# Patient Record
Sex: Female | Born: 1935 | Race: White | Hispanic: No | Marital: Single
Health system: Southern US, Community
[De-identification: ages and names within clinical notes are randomized; demographics above are authoritative.]

## PROBLEM LIST (undated history)

## (undated) DIAGNOSIS — G309 Alzheimer's disease, unspecified: Secondary | ICD-10-CM

## (undated) DIAGNOSIS — I10 Essential (primary) hypertension: Secondary | ICD-10-CM

## (undated) DIAGNOSIS — E119 Type 2 diabetes mellitus without complications: Secondary | ICD-10-CM

## (undated) DIAGNOSIS — F0280 Dementia in other diseases classified elsewhere without behavioral disturbance: Secondary | ICD-10-CM

---

## 2021-06-20 ENCOUNTER — Other Ambulatory Visit: Payer: Self-pay

## 2021-06-20 ENCOUNTER — Emergency Department (HOSPITAL_COMMUNITY)
Admission: EM | Admit: 2021-06-20 | Discharge: 2021-06-20 | Disposition: A | Discharge status: Home / Self Care | Payer: Medicare Other | Attending: Emergency Medicine | Admitting: Emergency Medicine

## 2021-06-20 ENCOUNTER — Emergency Department (HOSPITAL_COMMUNITY): Payer: Medicare Other

## 2021-06-20 ENCOUNTER — Encounter (HOSPITAL_COMMUNITY): Payer: Self-pay

## 2021-06-20 DIAGNOSIS — G309 Alzheimer's disease, unspecified: Secondary | ICD-10-CM | POA: Diagnosis not present

## 2021-06-20 DIAGNOSIS — M25552 Pain in left hip: Secondary | ICD-10-CM | POA: Insufficient documentation

## 2021-06-20 DIAGNOSIS — W19XXXA Unspecified fall, initial encounter: Secondary | ICD-10-CM

## 2021-06-20 DIAGNOSIS — E119 Type 2 diabetes mellitus without complications: Secondary | ICD-10-CM | POA: Insufficient documentation

## 2021-06-20 DIAGNOSIS — S0003XA Contusion of scalp, initial encounter: Secondary | ICD-10-CM | POA: Diagnosis not present

## 2021-06-20 DIAGNOSIS — E039 Hypothyroidism, unspecified: Secondary | ICD-10-CM | POA: Diagnosis not present

## 2021-06-20 DIAGNOSIS — W01198A Fall on same level from slipping, tripping and stumbling with subsequent striking against other object, initial encounter: Secondary | ICD-10-CM | POA: Insufficient documentation

## 2021-06-20 DIAGNOSIS — Y92121 Bathroom in nursing home as the place of occurrence of the external cause: Secondary | ICD-10-CM | POA: Diagnosis not present

## 2021-06-20 DIAGNOSIS — S0990XA Unspecified injury of head, initial encounter: Secondary | ICD-10-CM | POA: Diagnosis present

## 2021-06-20 DIAGNOSIS — I10 Essential (primary) hypertension: Secondary | ICD-10-CM | POA: Diagnosis not present

## 2021-06-20 HISTORY — DX: Essential (primary) hypertension: I10

## 2021-06-20 HISTORY — DX: Alzheimer's disease, unspecified: G30.9

## 2021-06-20 HISTORY — DX: Type 2 diabetes mellitus without complications: E11.9

## 2021-06-20 HISTORY — DX: Dementia in other diseases classified elsewhere, unspecified severity, without behavioral disturbance, psychotic disturbance, mood disturbance, and anxiety: F02.80

## 2021-06-20 NOTE — ED Notes (Signed)
Pt able to ambulate with walker at her baseline. Pt denies feeling lightheaded or dizzy. Pt has slow steady gait  ?

## 2021-06-20 NOTE — ED Provider Notes (Signed)
[facility]   CSN: [id number] Arrival date & time: 06/20/21  1039     History  Chief Complaint  Patient presents with   Madison BellingFall    Madison Cummings is a 88 y.o. female.  HPI      88 year old female with history of Alzheimer's, presents from [facility] with concern for unwitnessed fall.  She fell backwards hitting the back of her head. Reports left sided hip pain with EMS, denies at time of my evaluation.  Was able to stand and pivot with EMS.    Family at bedside reports she is at her baseline.  Deny other acute medical concerns.   She is hard of hearing, has dementia, denies any pain on my history. History limited by above.    Home Medications Prior to Admission medications   Not on File      Allergies    Patient has no known allergies.    Review of Systems   Review of Systems See above Physical Exam Updated Vital Signs BP 119/73 (BP Location: Right Arm)    Pulse 70    Temp 97.8 F (36.6 C) (Oral)    Resp 16    SpO2 96%  Physical Exam Vitals and nursing note reviewed.  Constitutional:      General: She is not in acute distress.    Appearance: She is well-developed. She is not diaphoretic.  HENT:     Head: Normocephalic.     Comments: Occipital scalp hematoma, abrasion Eyes:     Conjunctiva/sclera: Conjunctivae normal.  Cardiovascular:     Rate and Rhythm: Normal rate and regular rhythm.     Heart sounds: Normal heart sounds. No murmur heard.   No friction rub. No gallop.  Pulmonary:     Effort: Pulmonary effort is normal. No respiratory distress.     Breath sounds: Normal breath sounds. No wheezing or rales.  Chest:     Chest wall: No tenderness.  Abdominal:     General: There is no distension.     Palpations: Abdomen is soft.     Tenderness: There is no abdominal tenderness. There is no guarding.  Musculoskeletal:        General: No tenderness (no C/T/L/lower extremity or pelvic  tenderness).     Cervical back: Normal range of motion.  Skin:    General: Skin is warm and dry.     Findings: No erythema or rash.  Neurological:     Mental Status: She is alert and oriented to person, place, and time.    ED Results / Procedures / Treatments   Labs (all labs ordered are listed, but only abnormal results are displayed) Labs Reviewed - No data to display  EKG EKG Interpretation  Date/Time:  Monday June 20 2021 10:52:38 EDT Ventricular Rate:  80 PR Interval:  51 QRS Duration: 84 QT Interval:  413 QTC Calculation: 477 R Axis:   79 Text Interpretation: Sinus rhythm Short PR interval Low voltage, extremity leads Nonspecific T abnormalities, lateral leads No previous ECGs available Confirmed by Alvira MondaySchlossman, Emaleigh Guimond ([id number]) on 06/20/2021 10:57:47 AM  Radiology CT Head Wo Contrast  Result Date: 06/20/2021 CLINICAL DATA:  Unwitnessed fall, struck left side of head EXAM: CT HEAD WITHOUT CONTRAST CT CERVICAL SPINE WITHOUT CONTRAST TECHNIQUE: Multidetector CT imaging of the head and cervical spine was performed following the standard protocol without intravenous contrast. Multiplanar CT image reconstructions of the cervical spine were also generated. RADIATION DOSE REDUCTION: This exam was  performed according to the departmental dose-optimization program which includes automated exposure control, adjustment of the mA and/or kV according to patient size and/or use of iterative reconstruction technique. COMPARISON:  None. FINDINGS: CT HEAD FINDINGS Brain: No evidence of acute infarction, hemorrhage, hydrocephalus, extra-axial collection or mass lesion/mass effect. Periventricular and deep white matter hypodensity. Vascular: No hyperdense vessel or unexpected calcification. Skull: Normal. Negative for fracture or focal lesion. Sinuses/Orbits: No acute finding. Other: None. CT CERVICAL SPINE FINDINGS Alignment: Degenerative straightening of the normal cervical lordosis. Skull base and  vertebrae: No acute fracture. No primary bone lesion or focal pathologic process. Soft tissues and spinal canal: No prevertebral fluid or swelling. No visible canal hematoma. Disc levels: Moderate to severe disc space height loss and osteophytosis of the lower cervical levels worst from C5 through C7. Severe arthrosis of the atlantoaxial articulation. Upper chest: Negative. Other: None. IMPRESSION: 1. No acute intracranial pathology. Small-vessel white matter disease. 2. No fracture or static subluxation of the cervical spine. 3. Moderate to severe degenerative disc disease of the lower cervical levels. Severe arthrosis of the atlantoaxial articulation Electronically Signed   By: Jearld Lesch M.D.   On: 06/20/2021 12:18   CT Cervical Spine Wo Contrast  Result Date: 06/20/2021 CLINICAL DATA:  Unwitnessed fall, struck left side of head EXAM: CT HEAD WITHOUT CONTRAST CT CERVICAL SPINE WITHOUT CONTRAST TECHNIQUE: Multidetector CT imaging of the head and cervical spine was performed following the standard protocol without intravenous contrast. Multiplanar CT image reconstructions of the cervical spine were also generated. RADIATION DOSE REDUCTION: This exam was performed according to the departmental dose-optimization program which includes automated exposure control, adjustment of the mA and/or kV according to patient size and/or use of iterative reconstruction technique. COMPARISON:  None. FINDINGS: CT HEAD FINDINGS Brain: No evidence of acute infarction, hemorrhage, hydrocephalus, extra-axial collection or mass lesion/mass effect. Periventricular and deep white matter hypodensity. Vascular: No hyperdense vessel or unexpected calcification. Skull: Normal. Negative for fracture or focal lesion. Sinuses/Orbits: No acute finding. Other: None. CT CERVICAL SPINE FINDINGS Alignment: Degenerative straightening of the normal cervical lordosis. Skull base and vertebrae: No acute fracture. No primary bone lesion or focal  pathologic process. Soft tissues and spinal canal: No prevertebral fluid or swelling. No visible canal hematoma. Disc levels: Moderate to severe disc space height loss and osteophytosis of the lower cervical levels worst from C5 through C7. Severe arthrosis of the atlantoaxial articulation. Upper chest: Negative. Other: None. IMPRESSION: 1. No acute intracranial pathology. Small-vessel white matter disease. 2. No fracture or static subluxation of the cervical spine. 3. Moderate to severe degenerative disc disease of the lower cervical levels. Severe arthrosis of the atlantoaxial articulation Electronically Signed   By: Jearld Lesch M.D.   On: 06/20/2021 12:18   DG Hips Bilat W or Wo Pelvis 3-4 Views  Result Date: 06/20/2021 CLINICAL DATA:  Fall.  Left hip pain. EXAM: DG HIP (WITH OR WITHOUT PELVIS) 3-4V BILAT COMPARISON:  None. FINDINGS: No acute fracture or dislocation. Old left subcapital femoral neck fracture status post fixation with three cannulated screws. The hip joint spaces are relatively preserved. Osteopenia. Soft tissues are unremarkable. IMPRESSION: 1. No acute osseous abnormality. Electronically Signed   By: Obie Dredge M.D.   On: 06/20/2021 11:50    Procedures Procedures    Medications Ordered in ED Medications - No data to display  ED Course/ Medical Decision Making/ A&P  Medical Decision Making Amount and/or Complexity of Data Reviewed Radiology: ordered.    88 year old female with history of Alzheimer's, hypertension, hyperlipidemia, DM, hypothyroidism presents from [facility] with concern for unwitnessed fall.  CT head, CSpine personally reviewed by me and shows no sign of acute intracranial hemorrhage, no cervical spine fracture, does have degenerative cervical disc disease.    XR bilateral hips done given she had reported pain to this region with EMS, completed without abnormalities. Does not have tenderness to T/L spine, chest abdomen  or pelvis. Able to bear weight in ED. Do not suspect clinically significant injury or illness to require hospitalization or surgery.  She is at baseline per family. Patient discharged in stable condition with understanding of reasons to return.         Final Clinical Impression(s) / ED Diagnoses Final diagnoses:  Fall, initial encounter  Hematoma of scalp, initial encounter    Rx / DC Orders ED Discharge Orders     None         Alvira Monday, MD 06/20/21 2258

## 2021-06-20 NOTE — ED Triage Notes (Signed)
Pt arrived by EMS from [facility], pt has an unwitnessed fall in her bathroom. Pt fell backwards hitting the left side of the back of her head.  ?Pt also complaining of left sided hip pain with palpation, pelvis is stable. Pt able to stand and pivot at facility with EMS assistance.  ? ?Pt is hard of hearing and has hx of alzheimers, staff state pt seems more confused than her baseline.  ?Pt is not on blood thinners.  ?

## 2022-02-12 ENCOUNTER — Emergency Department (HOSPITAL_COMMUNITY): Payer: Medicare Other

## 2022-02-12 ENCOUNTER — Emergency Department (HOSPITAL_COMMUNITY)
Admission: EM | Admit: 2022-02-12 | Discharge: 2022-02-12 | Disposition: A | Discharge status: Home / Self Care | Payer: Medicare Other | Attending: Emergency Medicine | Admitting: Emergency Medicine

## 2022-02-12 DIAGNOSIS — G309 Alzheimer's disease, unspecified: Secondary | ICD-10-CM | POA: Insufficient documentation

## 2022-02-12 DIAGNOSIS — I10 Essential (primary) hypertension: Secondary | ICD-10-CM | POA: Diagnosis not present

## 2022-02-12 DIAGNOSIS — W06XXXA Fall from bed, initial encounter: Secondary | ICD-10-CM | POA: Diagnosis not present

## 2022-02-12 DIAGNOSIS — S51811A Laceration without foreign body of right forearm, initial encounter: Secondary | ICD-10-CM | POA: Insufficient documentation

## 2022-02-12 DIAGNOSIS — W19XXXA Unspecified fall, initial encounter: Secondary | ICD-10-CM

## 2022-02-12 DIAGNOSIS — S0990XA Unspecified injury of head, initial encounter: Secondary | ICD-10-CM | POA: Diagnosis not present

## 2022-02-12 DIAGNOSIS — E119 Type 2 diabetes mellitus without complications: Secondary | ICD-10-CM | POA: Insufficient documentation

## 2022-02-12 DIAGNOSIS — S59911A Unspecified injury of right forearm, initial encounter: Secondary | ICD-10-CM | POA: Diagnosis present

## 2022-02-12 LAB — CBG MONITORING, ED: Glucose-Capillary: 119 mg/dL — ABNORMAL HIGH (ref 70–99)

## 2022-02-12 MED ORDER — ACETAMINOPHEN 325 MG PO TABS
650.0000 mg | ORAL_TABLET | Freq: Once | ORAL | Status: AC
  Administered 2022-02-12: 650 mg via ORAL
  Filled 2022-02-12: qty 2

## 2022-02-12 MED ORDER — BACITRACIN ZINC 500 UNIT/GM EX OINT
TOPICAL_OINTMENT | Freq: Two times a day (BID) | CUTANEOUS | Status: DC
  Administered 2022-02-12: 15.7778 via TOPICAL
  Filled 2022-02-12: qty 0.9

## 2022-02-12 MED ORDER — LORAZEPAM 0.5 MG PO TABS
0.5000 mg | ORAL_TABLET | Freq: Once | ORAL | Status: AC
  Administered 2022-02-12: 0.5 mg via ORAL
  Filled 2022-02-12: qty 1

## 2022-02-12 NOTE — Discharge Instructions (Signed)
Please keep your right forearm skin tear clean with warm soap and water, dab dry and cover with Vaseline and nonadherent/nonstick dressing.  As an alternative form of dressing you may use Xeroform or petroleum gauze.  Continue to follow-up with your primary care doctor.  Your CT scan of your head and neck were without any fractures.  Your x-rays without any fractures of your forearm.

## 2022-02-12 NOTE — ED Provider Notes (Signed)
[facility] DEPT Provider Note   CSN: [id number] Arrival date & time: 02/12/22  0555     History  Chief Complaint  Patient presents with   Madison Cummings is a 88 y.o. female.   Fall  Patient is a 88 year old female with a past medical history significant for diabetes, hypertension, Alzheimer's.  She is a resident of [facility] and resides in the memory care unit.  Patient is pleasant and denies any symptoms.  She does not recall her fall.  She is oriented to herself which is her baseline.  I discussed with facility RN who confirmed the patient's baseline is in a x1 with her orientation being simply to herself.  Seems that she is able to walk with a walker which was confirmed by patient.  Seems that she got out of bed and fell earlier today striking her head against a nightstand.  She denies any head pain and per facility RN patient did not have any vomiting, seizures, loss of consciousness.  This was an observed fall.  She has a history of Alzheimer's.  She was placed in c-collar by EMS.      Home Medications Prior to Admission medications   Not on File      Allergies    Patient has no known allergies.    Review of Systems   Review of Systems  Physical Exam Updated Vital Signs BP (!) 155/67 (BP Location: Left Arm)   Pulse 62   Temp 97.8 F (36.6 C) (Oral)   Resp 14   SpO2 100%  Physical Exam Vitals and nursing note reviewed.  Constitutional:      General: She is not in acute distress. HENT:     Head: Normocephalic and atraumatic.     Nose: Nose normal.  Eyes:     General: No scleral icterus. Cardiovascular:     Rate and Rhythm: Normal rate and regular rhythm.     Pulses: Normal pulses.     Heart sounds: Normal heart sounds.  Pulmonary:     Effort: Pulmonary effort is normal. No respiratory distress.     Breath sounds: No wheezing.  Abdominal:     Palpations: Abdomen is soft.     Tenderness: There is no  abdominal tenderness.  Musculoskeletal:     Cervical back: Normal range of motion.     Right lower leg: No edema.     Left lower leg: No edema.     Comments: In C - Collar  No bony tenderness over joints or long bones of the upper and lower extremities.    No neck or back midline tenderness, step-off, deformity, or bruising. Able to turn head left and right 45 degrees without difficulty.  Full range of motion of upper and lower extremity joints shown after palpation was conducted; with 5/5 symmetrical strength in upper and lower extremities. No chest wall tenderness, no facial or cranial tenderness.   Patient has intact sensation grossly in lower and upper extremities. Intact patellar and ankle reflexes. Patient able to ambulate without difficulty.  Radial and DP pulses palpated BL.    Skin:    General: Skin is warm and dry.     Capillary Refill: Capillary refill takes less than 2 seconds.     Comments: Superficial skin tear of right forearm no active bleeding  Neurological:     Mental Status: She is alert. Mental status is at baseline.     Comments: Skin tear on R forearm.  No bleeding. Approximately 3cm in diameter.   Psychiatric:        Mood and Affect: Mood normal.        Behavior: Behavior normal.     ED Results / Procedures / Treatments   Labs (all labs ordered are listed, but only abnormal results are displayed) Labs Reviewed  CBG MONITORING, ED - Abnormal; Notable for the following components:      Result Value   Glucose-Capillary 119 (*)    All other components within normal limits    EKG None  Radiology CT HEAD WO CONTRAST ( )  Result Date: 02/12/2022 CLINICAL DATA:  Fall with neck trauma EXAM: CT HEAD WITHOUT CONTRAST CT CERVICAL SPINE WITHOUT CONTRAST TECHNIQUE: Multidetector CT imaging of the head and cervical spine was performed following the standard protocol without intravenous contrast. Multiplanar CT image reconstructions of the cervical spine were also  generated. RADIATION DOSE REDUCTION: This exam was performed according to the departmental dose-optimization program which includes automated exposure control, adjustment of the mA and/or kV according to patient size and/or use of iterative reconstruction technique. COMPARISON:  None Available. FINDINGS: CT HEAD FINDINGS Brain: No evidence of acute infarction, hemorrhage, hydrocephalus, extra-axial collection or mass lesion/mass effect. Generalized cerebral volume loss Vascular: No hyperdense vessel or unexpected calcification. Skull: Normal. Negative for fracture or focal lesion. Sinuses/Orbits: No evidence of injury CT CERVICAL SPINE FINDINGS Alignment: No traumatic malalignment. C4-5 degenerative anterolisthesis. Skull base and vertebrae: No acute fracture. No primary bone lesion or focal pathologic process. Soft tissues and spinal canal: No prevertebral fluid or swelling. No visible canal hematoma. Disc levels: Severe facet osteoarthritis which is diffuse. Especially advanced C1-2 facet degeneration on both sides with collapse and spurring causing bilateral C2 impingement. Retro dental ligamentous thickening causes mild or moderate spinal stenosis at this level. Facet ankylosis has occurred at multiple levels. Advanced lower cervical disc degeneration. Uncovertebral spurs encroach on the foramina at multiple levels. Upper chest: No acute finding IMPRESSION: 1. No evidence of acute intracranial or cervical spine injury. 2. Brain atrophy and severe cervical spine degeneration. Electronically Signed   By: Tiburcio Pea M.D.   On: 02/12/2022 10:19   CT Cervical Spine Wo Contrast  Result Date: 02/12/2022 CLINICAL DATA:  Fall with neck trauma EXAM: CT HEAD WITHOUT CONTRAST CT CERVICAL SPINE WITHOUT CONTRAST TECHNIQUE: Multidetector CT imaging of the head and cervical spine was performed following the standard protocol without intravenous contrast. Multiplanar CT image reconstructions of the cervical spine were  also generated. RADIATION DOSE REDUCTION: This exam was performed according to the departmental dose-optimization program which includes automated exposure control, adjustment of the mA and/or kV according to patient size and/or use of iterative reconstruction technique. COMPARISON:  None Available. FINDINGS: CT HEAD FINDINGS Brain: No evidence of acute infarction, hemorrhage, hydrocephalus, extra-axial collection or mass lesion/mass effect. Generalized cerebral volume loss Vascular: No hyperdense vessel or unexpected calcification. Skull: Normal. Negative for fracture or focal lesion. Sinuses/Orbits: No evidence of injury CT CERVICAL SPINE FINDINGS Alignment: No traumatic malalignment. C4-5 degenerative anterolisthesis. Skull base and vertebrae: No acute fracture. No primary bone lesion or focal pathologic process. Soft tissues and spinal canal: No prevertebral fluid or swelling. No visible canal hematoma. Disc levels: Severe facet osteoarthritis which is diffuse. Especially advanced C1-2 facet degeneration on both sides with collapse and spurring causing bilateral C2 impingement. Retro dental ligamentous thickening causes mild or moderate spinal stenosis at this level. Facet ankylosis has occurred at multiple levels. Advanced lower cervical disc degeneration. Uncovertebral spurs encroach  on the foramina at multiple levels. Upper chest: No acute finding IMPRESSION: 1. No evidence of acute intracranial or cervical spine injury. 2. Brain atrophy and severe cervical spine degeneration. Electronically Signed   By: Tiburcio Pea M.D.   On: 02/12/2022 10:19   DG Pelvis 1-2 Views  Result Date: 02/12/2022 CLINICAL DATA:  Fall.  Pelvic pain. EXAM: PELVIS - 1-2 VIEW COMPARISON:  None Available. FINDINGS: There is no evidence of pelvic fracture or diastasis. No pelvic bone lesions are seen. Internal fixation screws are seen in the left hip. Severe lower lumbar spine degenerative changes noted. Extensive bilateral iliac  and peripheral vascular calcification also seen. IMPRESSION: No acute findings. Severe lower lumbar spine degenerative changes. Electronically Signed   By: Danae Orleans M.D.   On: 02/12/2022 10:01   DG Chest 1 View  Result Date: 02/12/2022 CLINICAL DATA:  Fall.  Blunt chest trauma. EXAM: CHEST  1 VIEW COMPARISON:  None Available. FINDINGS: The heart size and mediastinal contours are within normal limits. Aortic atherosclerotic calcification incidentally noted. Both lungs are clear. No evidence of pneumothorax or hemothorax. IMPRESSION: No active disease. Electronically Signed   By: Danae Orleans M.D.   On: 02/12/2022 09:59   DG Forearm Right  Result Date: 02/12/2022 CLINICAL DATA:  Fall.  Right forearm injury and pain. EXAM: RIGHT FOREARM - 2 VIEW COMPARISON:  None Available. FINDINGS: There is no evidence of fracture or other focal bone lesions. Mild peripheral vascular calcification is seen as well as degenerative changes in the wrist. IMPRESSION: No acute findings. Electronically Signed   By: Danae Orleans M.D.   On: 02/12/2022 09:58    Procedures Procedures    Medications Ordered in ED Medications  bacitracin ointment (15.7778 Applications Topical Given 02/12/22 0830)  acetaminophen (TYLENOL) tablet 650 mg (650 mg Oral Given 02/12/22 0815)  LORazepam (ATIVAN) tablet 0.5 mg (0.5 mg Oral Given 02/12/22 0815)    ED Course/ Medical Decision Making/ A&P Clinical Course as of 02/12/22 1132  Sun Feb 12, 2022  1020 Chest pelvis and right forearm x-ray without fractures.  Degenerative changes of spine noted patient has no back pain or symptoms currently. [WF]  1038 CT HEAD AND CSPINE - no fractures or acute changes  IMPRESSION: 1. No evidence of acute intracranial or cervical spine injury. 2. Brain atrophy and severe cervical spine degeneration.   Electronically Signed   By: Tiburcio Pea M.D.   On: 02/12/2022 10:19   [WF]    Clinical Course User Index [WF] Madison Shelter, PA                            Medical Decision Making Amount and/or Complexity of Data Reviewed Radiology: ordered.  Risk OTC drugs. Prescription drug management.   Patient is a 88 year old female with a past medical history significant for diabetes, hypertension, Alzheimer's.  She is a resident of [facility] and resides in the memory care unit.  Patient is pleasant and denies any symptoms.  She does not recall her fall.  She is oriented to herself which is her baseline.  I discussed with facility RN who confirmed the patient's baseline is in a x1 with her orientation being simply to herself.  Seems that she is able to walk with a walker which was confirmed by patient.  Seems that she got out of bed and fell earlier today striking her head against a nightstand.  She denies any head pain and per facility  RN patient did not have any vomiting, seizures, loss of consciousness.  This was an observed fall.  She has a history of Alzheimer's.  She was placed in c-collar by EMS.  CBG 119 CT head, C-spine, plain film of the pelvis chest and right forearm without any fractures or pneumothorax.  I discussed this case with my attending physician who cosigned this note including patient's presenting symptoms, physical exam, and planned diagnostics and interventions. Attending physician stated agreement with plan or made changes to plan which were implemented.   Attending physician assessed patient at bedside.   Reassuring work-up here.  Patient is asymptomatic.  Vital signs within normal limits.  Will discharge home.  C-collar removed   Final Clinical Impression(s) / ED Diagnoses Final diagnoses:  Fall, initial encounter  Traumatic injury of head, initial encounter  Skin tear of right forearm without complication, initial encounter    Rx / DC Orders ED Discharge Orders     None         Madison Cummings, [place] 02/12/22 1132    Madison Octave, MD 02/12/22 1238

## 2022-02-12 NOTE — ED Triage Notes (Signed)
Director was in pt room. Pt attempted to get up and fell out of the bed hitting head on nightstand. No visible head deformities. Skin tear noted on right forearm. Ems placed pt on C collar. Pt nonverbal. Hx of alzheimer.  142/72 146 CBG 60 HR  100 RA 12 RR

## 2022-03-23 ENCOUNTER — Other Ambulatory Visit: Payer: Self-pay

## 2022-03-23 ENCOUNTER — Emergency Department (HOSPITAL_COMMUNITY)
Admission: EM | Admit: 2022-03-23 | Discharge: 2022-03-23 | Disposition: A | Discharge status: Other Acute Inpatient Hospital | Payer: Medicare Other | Attending: Emergency Medicine | Admitting: Emergency Medicine

## 2022-03-23 ENCOUNTER — Emergency Department (HOSPITAL_COMMUNITY): Payer: Medicare Other

## 2022-03-23 DIAGNOSIS — I10 Essential (primary) hypertension: Secondary | ICD-10-CM | POA: Insufficient documentation

## 2022-03-23 DIAGNOSIS — M25512 Pain in left shoulder: Secondary | ICD-10-CM | POA: Diagnosis present

## 2022-03-23 DIAGNOSIS — F0280 Dementia in other diseases classified elsewhere without behavioral disturbance: Secondary | ICD-10-CM | POA: Diagnosis not present

## 2022-03-23 DIAGNOSIS — G309 Alzheimer's disease, unspecified: Secondary | ICD-10-CM | POA: Insufficient documentation

## 2022-03-23 DIAGNOSIS — E119 Type 2 diabetes mellitus without complications: Secondary | ICD-10-CM | POA: Diagnosis not present

## 2022-03-23 DIAGNOSIS — M19012 Primary osteoarthritis, left shoulder: Secondary | ICD-10-CM

## 2022-03-23 MED ORDER — ACETAMINOPHEN 500 MG PO TABS: 500.0000 mg | ORAL_TABLET | Freq: Every morning | ORAL | 0 refills | Status: AC

## 2022-03-23 NOTE — ED Triage Notes (Signed)
EMS reports from [facility]. Staff states Pt c/o pain left shoulder since last night. Staff states no fall or other injury. Pt poor historian but denies pain.  BP 128/74 HR 64 RR 16 Sp02 96 RA

## 2022-03-23 NOTE — Discharge Instructions (Addendum)
Thank you for allowing me to be part of your care today.  X-rays did not reveal any broken bones, but did show arthritic changes.  I am prescribing a 500mg  Tylenol to be given every morning to help with this pain.  Please follow-up with PCP as needed.

## 2022-03-23 NOTE — ED Provider Notes (Signed)
Accepted handoff at shift change from [facility], PA-C. Please see prior provider note for more detail.   Briefly: Patient is 88 y.o. with history of Alzheimer's dementia, DM, HTN, presents to ED with concern of left shoulder and right hip pain.  Patient comes from facility who report she did not have a fall.  Patient is a poor historian due to her dementia.   DDX: concern for acute fracture or dislocation, ACS  Plan: ECG obtained due to left shoulder pain, will discharge home if ECG normal   Results for orders placed or performed during the hospital encounter of 02/12/22  POC CBG, ED  Result Value Ref Range   Glucose-Capillary 119 (H) 70 - 99 mg/dL   DG Hip Unilat W or Wo Pelvis 2-3 Views Left  Result Date: 03/23/2022 CLINICAL DATA:  Left hip pain EXAM: DG HIP (WITH OR WITHOUT PELVIS) 2-3V LEFT COMPARISON:  02/12/2022 FINDINGS: Frontal view of the pelvis as well as frontal and frogleg lateral views of the left hip are obtained. Cannulated screws left hip unchanged. No acute fracture, subluxation, or dislocation. Symmetrical bilateral hip osteoarthritis unchanged. Stable lower lumbar degenerative changes. Extensive atherosclerosis. IMPRESSION: 1. No acute fracture. 2. Stable bilateral hip osteoarthritis and lower lumbar degenerative change. Electronically Signed   By: Sharlet Salina M.D.   On: 03/23/2022 14:58   DG Shoulder Left  Result Date: 03/23/2022 CLINICAL DATA:  Left shoulder pain since yesterday EXAM: LEFT SHOULDER - 2+ VIEW COMPARISON:  None Available. FINDINGS: Internal rotation, external rotation, and transscapular views of the left shoulder are obtained. No acute displaced fractures. Moderate glenohumeral and acromioclavicular joint osteoarthritis. There is complete effacement of the acromial humeral interval consistent with chronic longstanding rotator cuff tear. Soft tissues are unremarkable. Left chest is clear. IMPRESSION: 1. Degenerative changes of the left shoulder as above.  No acute fracture. Electronically Signed   By: Sharlet Salina M.D.   On: 03/23/2022 14:56   DG Hip Unilat W or Wo Pelvis 2-3 Views Right  Result Date: 03/23/2022 CLINICAL DATA:  Right hip pain EXAM: DG HIP (WITH OR WITHOUT PELVIS) 2-3V RIGHT COMPARISON:  06/20/2021, 02/12/2022 FINDINGS: Frontal view of the pelvis as well as frontal and frogleg lateral views of the right hip are obtained. Cannulated screws left hip unchanged. No acute fracture, subluxation, or dislocation. Stable bilateral hip osteoarthritis. Stable degenerative changes lower lumbar spine. Extensive atherosclerosis. IMPRESSION: 1. No acute fracture. 2. Stable bilateral hip osteoarthritis and lower lumbar degenerative change. Electronically Signed   By: Sharlet Salina M.D.   On: 03/23/2022 14:55       Physical Exam  BP 138/68   Pulse 66   Temp 97.9 F (36.6 C) (Oral)   Resp 16   SpO2 98%   Physical Exam Vitals and nursing note reviewed.  Constitutional:      General: She is not in acute distress.    Appearance: Normal appearance. She is not ill-appearing or diaphoretic.  Pulmonary:     Effort: Pulmonary effort is normal.  Musculoskeletal:     Left shoulder: No swelling, deformity, tenderness, bony tenderness or crepitus. Decreased range of motion. Normal pulse.     Comments: No tenderness to palpation over [facility] joint, acromion process, or proximal humerus; mildly decreased range of motion, patient does not complain of pain with passive ROM  Neurological:     Mental Status: She is alert. Mental status is at baseline.  Psychiatric:        Mood and Affect: Mood normal.  Behavior: Behavior normal.     Procedures  Procedures  ED Course / MDM   Clinical Course as of 03/23/22 1645  Thu Mar 23, 2022  1348 Spoke with patient RN at [facility] who notes that pt didn't have a fall. Pt just noted left shoulder pain and right hip pain.  [SB]  5354 88 year old female here for left shoulder right hip pain.   Patient is a poor historian.  No reported falls.  Getting imaging. [MB]    Clinical Course User Index [MB] Terrilee Files, MD [SB] Karenann Cai, PA-C   Medical Decision Making Amount and/or Complexity of Data Reviewed Radiology: ordered.  Patient has no evidence of acute fracture in left shoulder, right hip/pelvis.  ECG not suggestive of ACS.  ECG demonstrates sinus rhythm with occasional PVCs and a shortened PR interval; no evidence of ischemia or infarction.  Patient's symptoms are likely secondary to arthritic changes as her symptoms are worse in the AM and improve throughout the day per daughter at bedside.  Daughter is requesting prescription be given for Tylenol every AM to help with arthritic pain.  Patient is appropriate for discharge back to [facility].    The patient has been appropriately medically screened and/or stabilized in the ED. I have low suspicion for any other emergent medical condition which would require further screening, evaluation or treatment in the ED or require inpatient management. At time of discharge the patient is hemodynamically stable and in no acute distress. I have discussed work-up results and diagnosis with patient and answered all questions. Patient's daughter is agreeable with discharge plan. We discussed strict return precautions for returning to the emergency department and they verbalized understanding.          Melton Alar [place], [place] 03/23/22 1645    Bethann Berkshire, MD 03/25/22 1037

## 2022-03-23 NOTE — ED Provider Notes (Signed)
[facility]-EMERGENCY DEPT Provider Note   CSN: [id number] Arrival date & time: 03/23/22  1226     History  Chief Complaint  Patient presents with   Shoulder Pain   Level 5 Caveat: Dementia  Madison Cummings is a 88 y.o. female with a PMHx of alzheimer's, DM, HTN, who presents to the ED with concerns for left shoulder pain.  Patient from [facility] facility.  Per RN at facility patient without any fall, hitting her head, injury, trauma.  Patient noted right hip pain with facility.  The history is provided by the patient. No language interpreter was used.       Home Medications Prior to Admission medications   Not on File      Allergies    Patient has no known allergies.    Review of Systems   Review of Systems  Unable to perform ROS: Dementia    Physical Exam Updated Vital Signs BP (!) 156/60   Pulse 74   Temp 97.9 F (36.6 C) (Oral)   Resp 16   SpO2 100%  Physical Exam Vitals and nursing note reviewed.  Constitutional:      General: She is not in acute distress.    Appearance: Normal appearance.  Eyes:     General: No scleral icterus.    Extraocular Movements: Extraocular movements intact.  Cardiovascular:     Rate and Rhythm: Normal rate.  Pulmonary:     Effort: Pulmonary effort is normal. No respiratory distress.  Chest:     Chest wall: No tenderness.     Comments: No chest wall tenderness to palpation. Abdominal:     Palpations: Abdomen is soft. There is no mass.     Tenderness: There is no abdominal tenderness.  Musculoskeletal:        General: Normal range of motion.     Cervical back: Neck supple.     Comments: TTP with abduction of left shoulder at anterior proximal left humerus. No TTP of left shoulder. Radial pulse intact. No TTP noted to left olecranon. Full active ROM of bilateral hips without TTP.   Skin:    General: Skin is warm and dry.     Findings: No rash.  Neurological:     Mental Status: She is alert.      Sensory: Sensation is intact.     Motor: Motor function is intact.  Psychiatric:        Behavior: Behavior normal.     ED Results / Procedures / Treatments   Labs (all labs ordered are listed, but only abnormal results are displayed) Labs Reviewed - No data to display  EKG None  Radiology DG Hip Unilat W or Wo Pelvis 2-3 Views Left  Result Date: 03/23/2022 CLINICAL DATA:  Left hip pain EXAM: DG HIP (WITH OR WITHOUT PELVIS) 2-3V LEFT COMPARISON:  02/12/2022 FINDINGS: Frontal view of the pelvis as well as frontal and frogleg lateral views of the left hip are obtained. Cannulated screws left hip unchanged. No acute fracture, subluxation, or dislocation. Symmetrical bilateral hip osteoarthritis unchanged. Stable lower lumbar degenerative changes. Extensive atherosclerosis. IMPRESSION: 1. No acute fracture. 2. Stable bilateral hip osteoarthritis and lower lumbar degenerative change. Electronically Signed   By: Sharlet Salina M.D.   On: 03/23/2022 14:58   DG Shoulder Left  Result Date: 03/23/2022 CLINICAL DATA:  Left shoulder pain since yesterday EXAM: LEFT SHOULDER - 2+ VIEW COMPARISON:  None Available. FINDINGS: Internal rotation, external rotation, and transscapular views of the left shoulder  are obtained. No acute displaced fractures. Moderate glenohumeral and acromioclavicular joint osteoarthritis. There is complete effacement of the acromial humeral interval consistent with chronic longstanding rotator cuff tear. Soft tissues are unremarkable. Left chest is clear. IMPRESSION: 1. Degenerative changes of the left shoulder as above. No acute fracture. Electronically Signed   By: Sharlet Salina M.D.   On: 03/23/2022 14:56   DG Hip Unilat W or Wo Pelvis 2-3 Views Right  Result Date: 03/23/2022 CLINICAL DATA:  Right hip pain EXAM: DG HIP (WITH OR WITHOUT PELVIS) 2-3V RIGHT COMPARISON:  06/20/2021, 02/12/2022 FINDINGS: Frontal view of the pelvis as well as frontal and frogleg lateral views of  the right hip are obtained. Cannulated screws left hip unchanged. No acute fracture, subluxation, or dislocation. Stable bilateral hip osteoarthritis. Stable degenerative changes lower lumbar spine. Extensive atherosclerosis. IMPRESSION: 1. No acute fracture. 2. Stable bilateral hip osteoarthritis and lower lumbar degenerative change. Electronically Signed   By: Sharlet Salina M.D.   On: 03/23/2022 14:55    Procedures Procedures    Medications Ordered in ED Medications - No data to display  ED Course/ Medical Decision Making/ A&P Clinical Course as of 03/23/22 1524  Thu Mar 23, 2022  1348 Spoke with patient RN at [facility] who notes that pt didn't have a fall. Pt just noted left shoulder pain and right hip pain.  [SB]  5658 88 year old female here for left shoulder right hip pain.  Patient is a poor historian.  No reported falls.  Getting imaging. [MB]    Clinical Course User Index [MB] Terrilee Files, MD [SB] Karenann Cai, PA-C                           Medical Decision Making Amount and/or Complexity of Data Reviewed Radiology: ordered.   Pt presents with left shoulder pain from [place]. Vital signs, pt afebrile. On exam, pt with TTP with abduction of left shoulder at anterior proximal left humerus. No TTP of left shoulder. Radial pulse intact. No TTP noted to left olecranon. Full active ROM of bilateral hips without TTP.  No chest wall TTP. No acute cardiovascular, respiratory, exam findings. Differential diagnosis includes fracture, dislocation, ACS.    Co morbidities that complicate the patient evaluation: DM, HTN, Alzheimers  Additional history obtained:  Additional history obtained from Daughter/Son and Caregiver  Imaging: I ordered imaging studies including  left shoulder, bilateral hip X-ray I independently visualized and interpreted imaging which showed: no acute findings I agree with the radiologist interpretation  Patient case discussed with  Melton Alar, PA-C at sign-out. Plan at sign-out is pending EKG and disposition per EKG, however, plans may change as per oncoming team. Patient care transferred at sign out.    This chart was dictated using voice recognition software, Dragon. Despite the best efforts of this provider to proofread and correct errors, errors may still occur which can change documentation meaning.   Final Clinical Impression(s) / ED Diagnoses Final diagnoses:  Acute pain of left shoulder    Rx / DC Orders ED Discharge Orders     None         Amybeth Sieg A, PA-C 03/23/22 1524    Terrilee Files, MD 03/23/22 2044

## 2024-01-01 IMAGING — CT CT HEAD W/O CM
4 series · 14 of 47 positions shown, 16 images · non-contrast
Comparison: None.

CLINICAL DATA: Unwitnessed fall, struck left side of head



[Series 3: head without · axial · non-contrast · 0.45mm/px · z∈[-17,+98]mm · 6 of 33 slices shown, 8 images]
[im 5/33  brain]
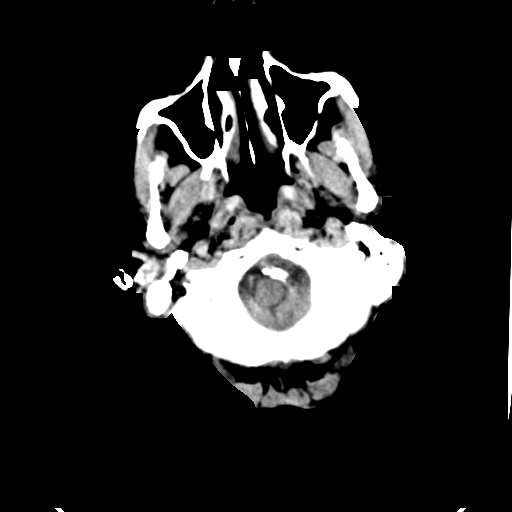
[im 5/33  bone]
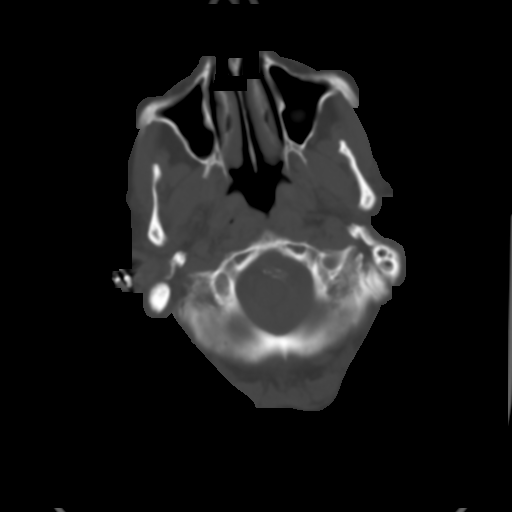
[im 10/33  brain]
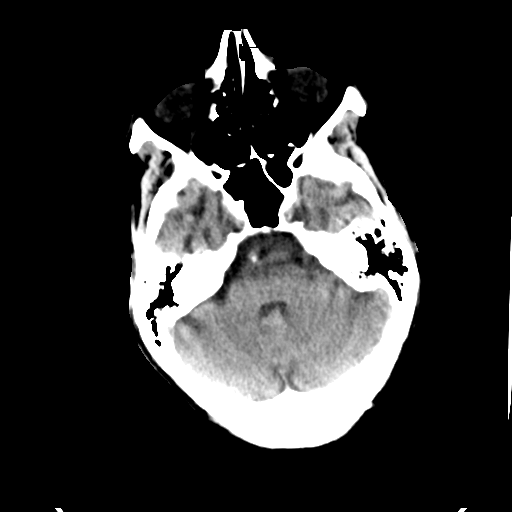
[im 14/33  brain]
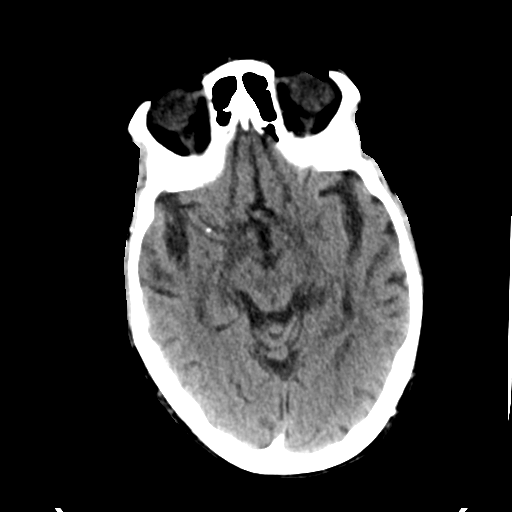
[im 19/33  brain]
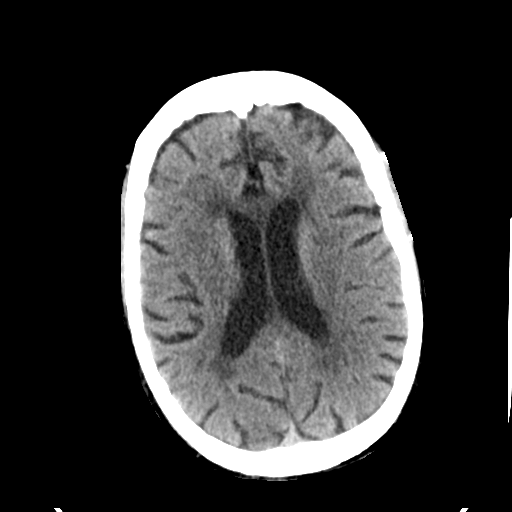
[im 23/33  brain]
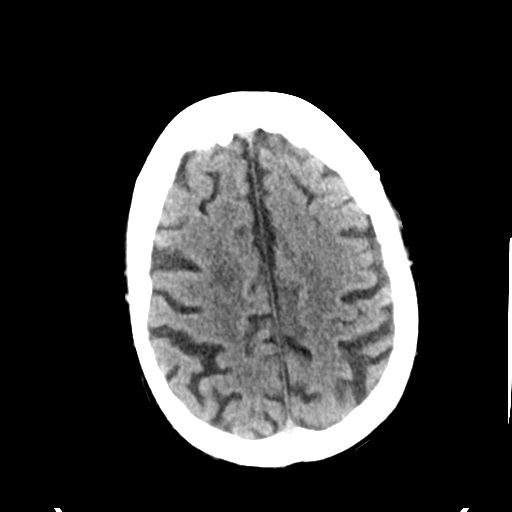
[im 23/33  bone]
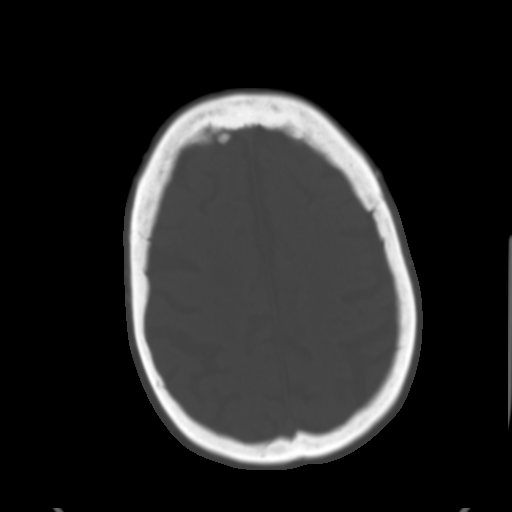
[im 28/33  brain]
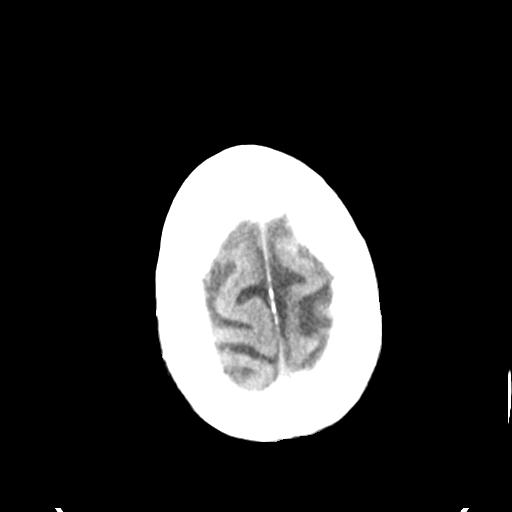

[Series 4: ax head bone · axial · 0.38mm/px · z∈[-33,-15]mm · 2 of 91 slices shown]
[im 9/91  bone]
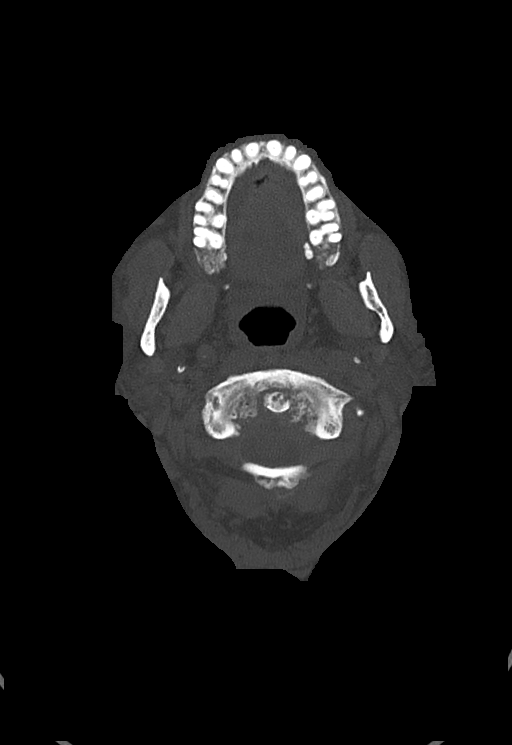
[im 18/91  bone]
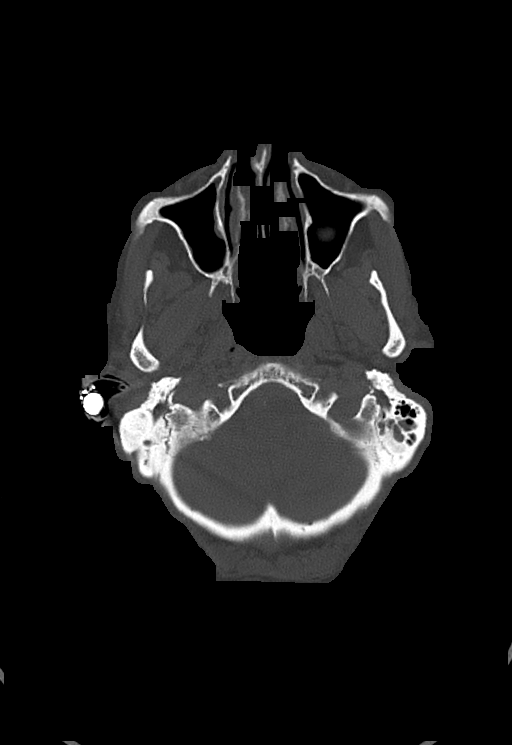

[Series 5: head without cor · coronal · non-contrast · 0.34mm/px · 3 of 67 slices shown]
[im 23/67  brain]
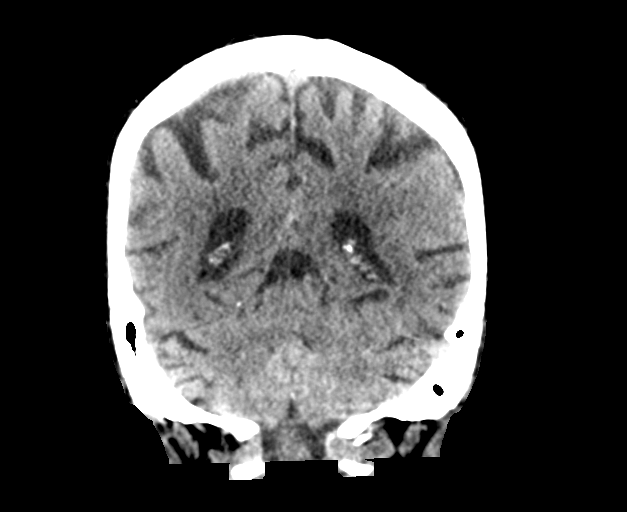
[im 30/67  brain]
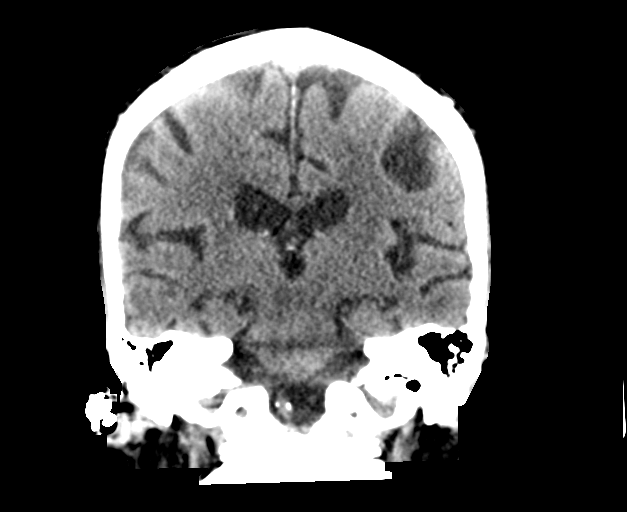
[im 37/67  brain]
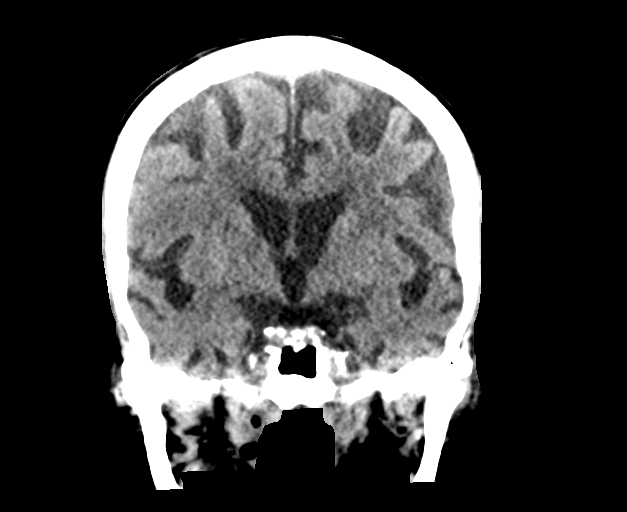

[Series 6: head without sag · sagittal · non-contrast · 0.35mm/px · 3 of 67 slices shown]
[im 23/67  brain]
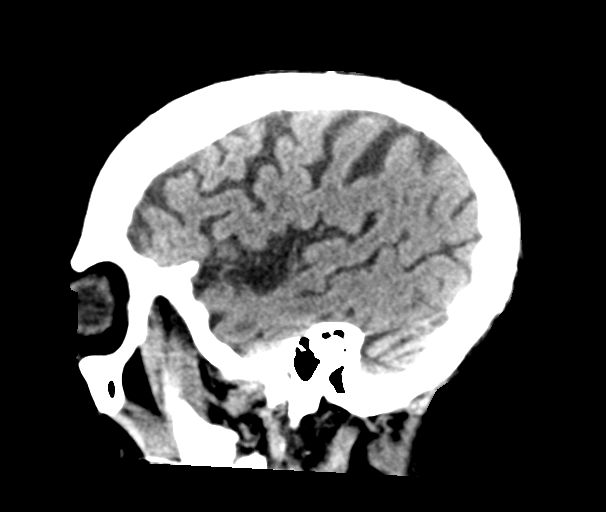
[im 34/67  brain]
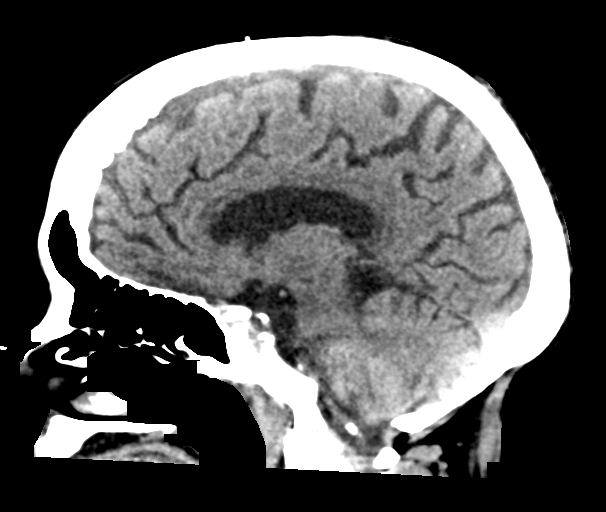
[im 45/67  brain]
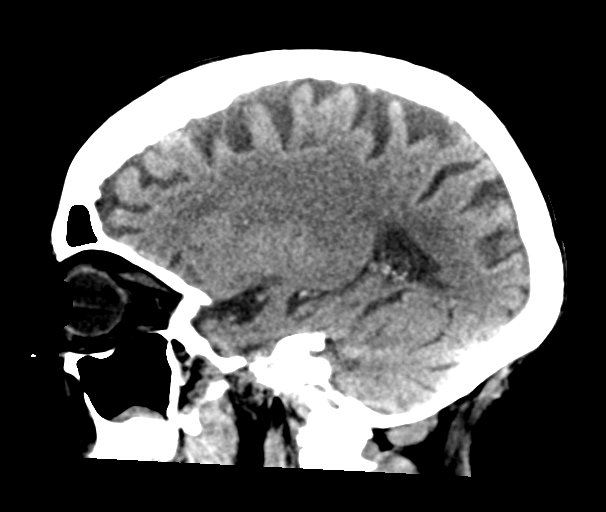

[14 of 47 positions shown; findings below may reference images not displayed]

FINDINGS: CT HEAD FINDINGS

Brain: No evidence of acute infarction, hemorrhage, hydrocephalus,
extra-axial collection or mass lesion/mass effect. Periventricular
and deep white matter hypodensity.

Vascular: No hyperdense vessel or unexpected calcification.

Skull: Normal. Negative for fracture or focal lesion.

Sinuses/Orbits: No acute finding.

Other: None.

CT CERVICAL SPINE FINDINGS

Alignment: Degenerative straightening of the normal cervical
lordosis.

Skull base and vertebrae: No acute fracture. No primary bone lesion
or focal pathologic process.

Soft tissues and spinal canal: No prevertebral fluid or swelling. No
visible canal hematoma.

Disc levels: Moderate to severe disc space height loss and
osteophytosis of the lower cervical levels worst from C5 through C7.
Severe arthrosis of the atlantoaxial articulation.

Upper chest: Negative.

Other: None.
IMPRESSION: 1. No acute intracranial pathology. Small-vessel white matter
disease.
2. No fracture or static subluxation of the cervical spine.
3. Moderate to severe degenerative disc disease of the lower
cervical levels. Severe arthrosis of the atlantoaxial articulation

## 2024-01-01 IMAGING — CR DG HIP (WITH OR WITHOUT PELVIS) 3-4V BILAT
5 series · 5 of 5 positions shown · non-contrast
Comparison: None.

CLINICAL DATA: Fall.  Left hip pain.

EXAM:
DG HIP (WITH OR WITHOUT PELVIS) 3-4V BILAT

[hip ap (1 of 2)]
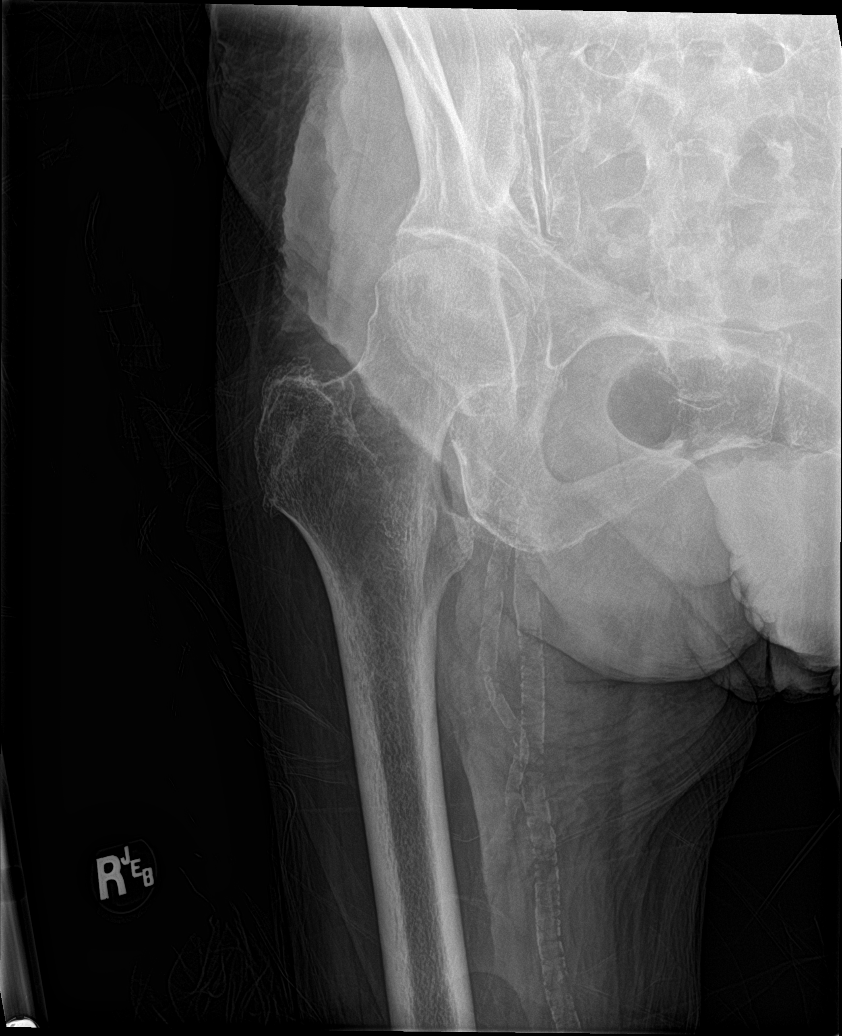

[hip lat (1 of 2)]
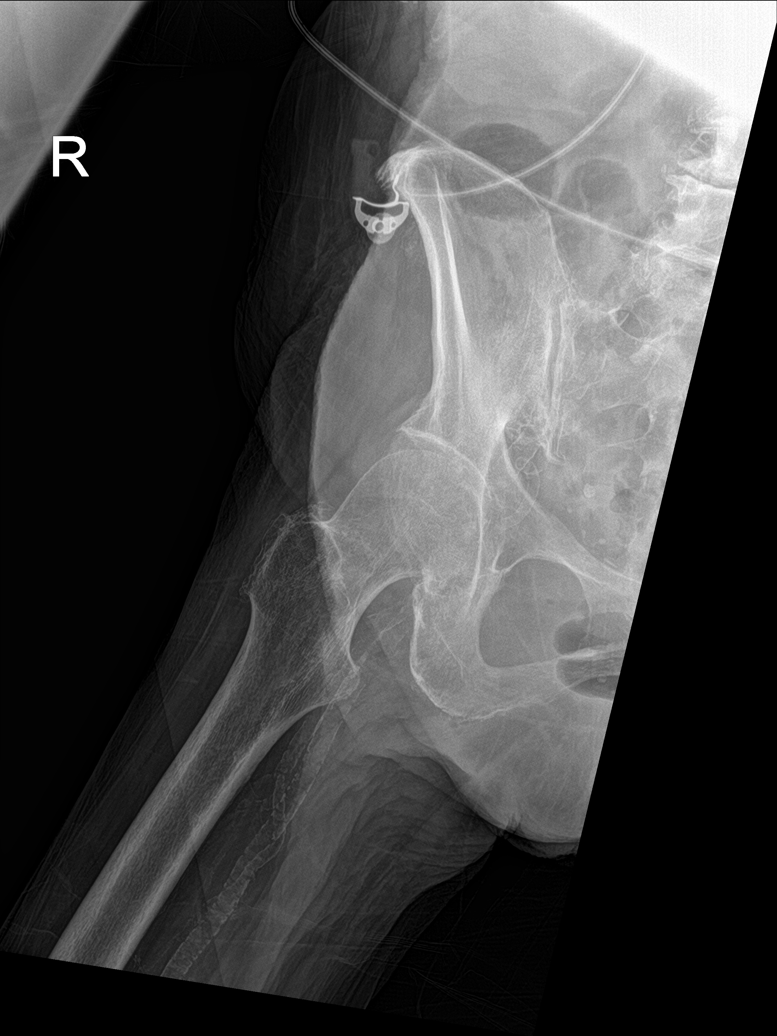

[hip ap (2 of 2)]
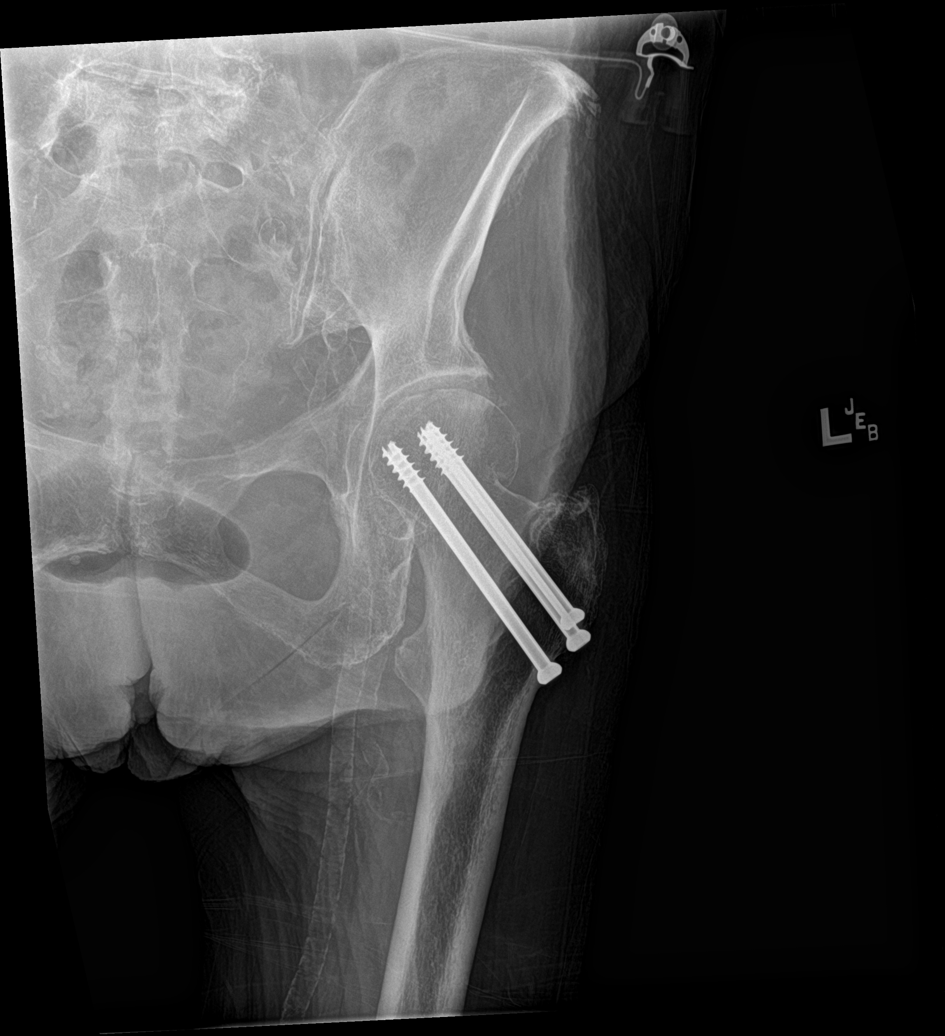

[hip lat (2 of 2)]
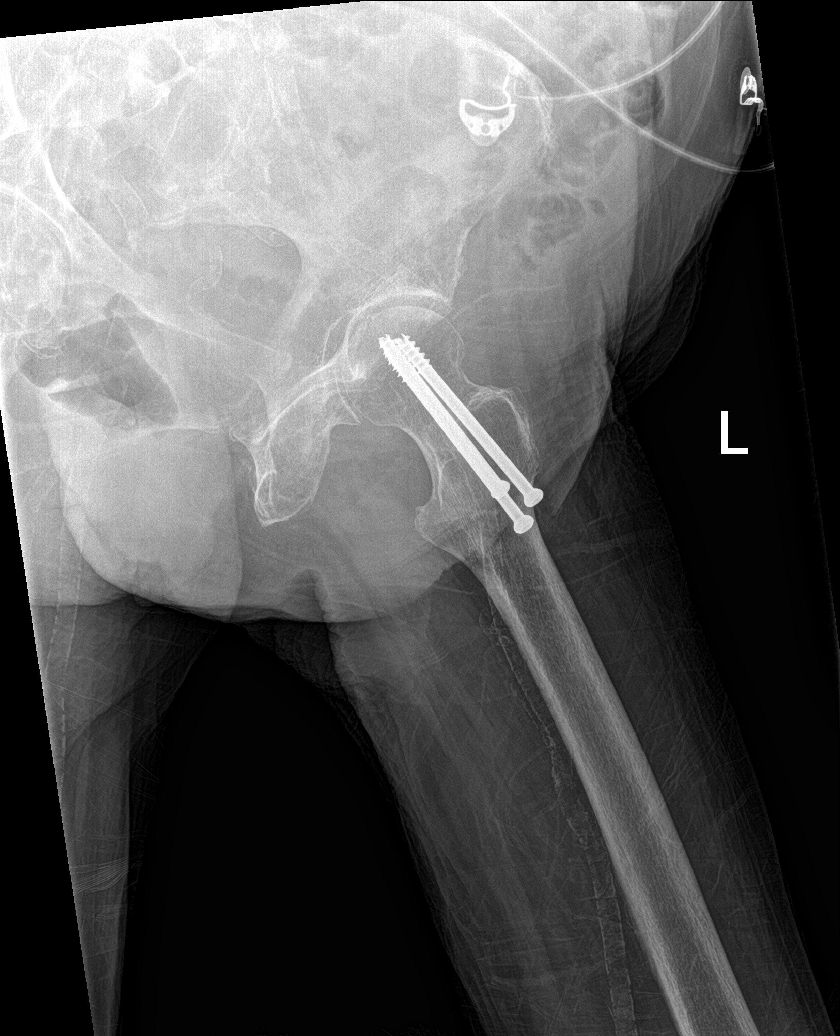

[pelvis ap]
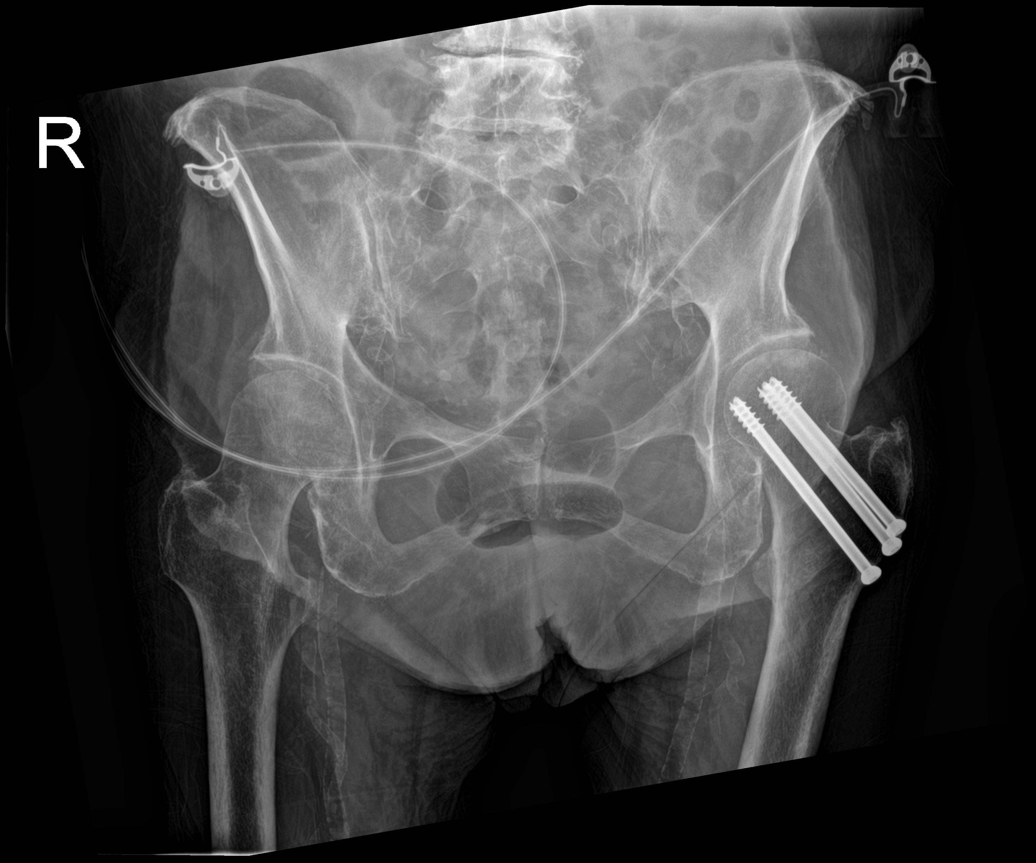

[5 of 5 positions shown; findings below may reference images not displayed]

FINDINGS: No acute fracture or dislocation. Old left subcapital femoral neck
fracture status post fixation with three cannulated screws. The hip
joint spaces are relatively preserved. Osteopenia. Soft tissues are
unremarkable.
IMPRESSION: 1. No acute osseous abnormality.
